# Patient Record
Sex: Female | Born: 1990 | Race: Black or African American | Hispanic: No | Marital: Single | Smoking: Never smoker
Health system: Southern US, Community
[De-identification: ages and names within clinical notes are randomized; demographics above are authoritative.]

## PROBLEM LIST (undated history)

## (undated) ENCOUNTER — Inpatient Hospital Stay (HOSPITAL_COMMUNITY): Payer: Self-pay

## (undated) DIAGNOSIS — Z789 Other specified health status: Secondary | ICD-10-CM

## (undated) HISTORY — PX: NO PAST SURGERIES: SHX2092

---

## 2016-03-27 LAB — OB RESULTS CONSOLE ABO/RH: RH Type: POSITIVE

## 2016-03-27 LAB — OB RESULTS CONSOLE HEPATITIS B SURFACE ANTIGEN: Hepatitis B Surface Ag: NEGATIVE

## 2016-03-27 LAB — OB RESULTS CONSOLE RUBELLA ANTIBODY, IGM: RUBELLA: IMMUNE

## 2016-03-27 LAB — OB RESULTS CONSOLE RPR: RPR: NONREACTIVE

## 2016-03-27 LAB — OB RESULTS CONSOLE GC/CHLAMYDIA
CHLAMYDIA, DNA PROBE: NEGATIVE
GC PROBE AMP, GENITAL: NEGATIVE

## 2016-03-27 LAB — OB RESULTS CONSOLE HIV ANTIBODY (ROUTINE TESTING): HIV: NONREACTIVE

## 2016-08-09 ENCOUNTER — Encounter: Payer: Self-pay | Admitting: Obstetrics and Gynecology

## 2016-08-09 ENCOUNTER — Inpatient Hospital Stay (HOSPITAL_COMMUNITY): Payer: Medicaid Other

## 2016-08-09 ENCOUNTER — Encounter (HOSPITAL_COMMUNITY): Payer: Self-pay | Admitting: *Deleted

## 2016-08-09 ENCOUNTER — Inpatient Hospital Stay (HOSPITAL_COMMUNITY)
Admission: AD | Admit: 2016-08-09 | Discharge: 2016-08-16 | DRG: 782 | Disposition: A | Discharge status: Home / Self Care | Payer: Medicaid Other | Source: Ambulatory Visit | Attending: Obstetrics & Gynecology | Admitting: Obstetrics & Gynecology

## 2016-08-09 DIAGNOSIS — O99213 Obesity complicating pregnancy, third trimester: Secondary | ICD-10-CM | POA: Diagnosis present

## 2016-08-09 DIAGNOSIS — O4410 Placenta previa with hemorrhage, unspecified trimester: Secondary | ICD-10-CM | POA: Diagnosis present

## 2016-08-09 DIAGNOSIS — Z3A29 29 weeks gestation of pregnancy: Secondary | ICD-10-CM | POA: Diagnosis not present

## 2016-08-09 DIAGNOSIS — Z6841 Body Mass Index (BMI) 40.0 and over, adult: Secondary | ICD-10-CM | POA: Diagnosis not present

## 2016-08-09 DIAGNOSIS — O4413 Placenta previa with hemorrhage, third trimester: Principal | ICD-10-CM | POA: Diagnosis present

## 2016-08-09 DIAGNOSIS — Z3A28 28 weeks gestation of pregnancy: Secondary | ICD-10-CM | POA: Diagnosis not present

## 2016-08-09 HISTORY — DX: Other specified health status: Z78.9

## 2016-08-09 LAB — CBC
HCT: 35.5 % — ABNORMAL LOW (ref 36.0–46.0)
HEMATOCRIT: 35.6 % — AB (ref 36.0–46.0)
HEMOGLOBIN: 12 g/dL (ref 12.0–15.0)
HEMOGLOBIN: 11.8 g/dL — AB (ref 12.0–15.0)
MCH: 27.9 pg (ref 26.0–34.0)
MCH: 28.2 pg (ref 26.0–34.0)
MCHC: 33.7 g/dL (ref 30.0–36.0)
MCHC: 33.2 g/dL (ref 30.0–36.0)
MCV: 83.8 fL (ref 78.0–100.0)
MCV: 83.9 fL (ref 78.0–100.0)
Platelets: 340 10*3/uL (ref 150–400)
Platelets: 346 10*3/uL (ref 150–400)
RBC: 4.25 MIL/uL (ref 3.87–5.11)
RBC: 4.23 MIL/uL (ref 3.87–5.11)
RDW: 14.1 % (ref 11.5–15.5)
RDW: 14.2 % (ref 11.5–15.5)
WBC: 11.5 10*3/uL — AB (ref 4.0–10.5)
WBC: 13.5 10*3/uL — AB (ref 4.0–10.5)

## 2016-08-09 LAB — DIC (DISSEMINATED INTRAVASCULAR COAGULATION)PANEL
D-Dimer, Quant: 1.13 ug/mL-FEU — ABNORMAL HIGH (ref 0.00–0.50)
Fibrinogen: 727 mg/dL — ABNORMAL HIGH (ref 210–475)
INR: 1.06
Prothrombin Time: 13.8 seconds (ref 11.4–15.2)
aPTT: 27 seconds (ref 24–36)

## 2016-08-09 LAB — PREPARE RBC (CROSSMATCH)

## 2016-08-09 LAB — KLEIHAUER-BETKE STAIN
# VIALS RHIG: 1
FETAL CELLS %: 0 %
QUANTITATION FETAL HEMOGLOBIN: 0 mL

## 2016-08-09 LAB — ABO/RH: ABO/RH(D): B POS

## 2016-08-09 LAB — TYPE AND SCREEN
ABO/RH(D): B POS
ANTIBODY SCREEN: NEGATIVE

## 2016-08-09 LAB — DIC (DISSEMINATED INTRAVASCULAR COAGULATION) PANEL
PLATELETS: 337 10*3/uL (ref 150–400)
SMEAR REVIEW: NONE SEEN

## 2016-08-09 IMAGING — US US MFM OB LIMITED
1 series · 12 of 14 positions shown · non-contrast
Comparison: none

[Series 1: us mfm ob limited · 14 acquisitions, 12 frames shown]
[im 1/14]
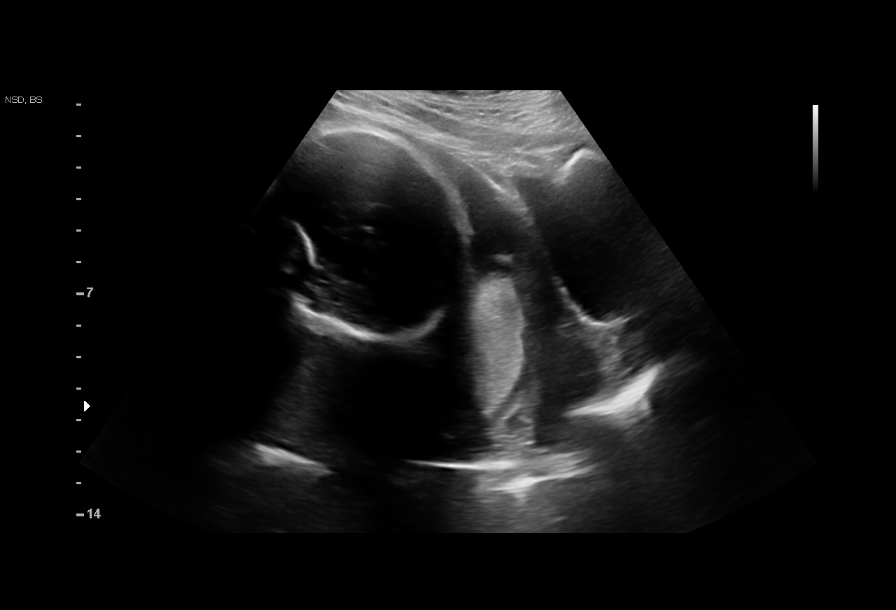
[im 2/14]
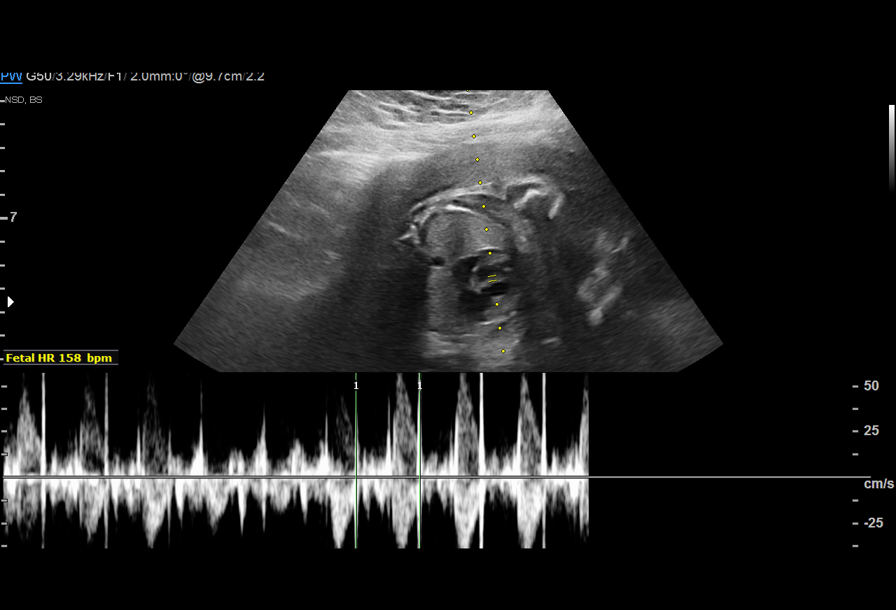
[im 3/14]
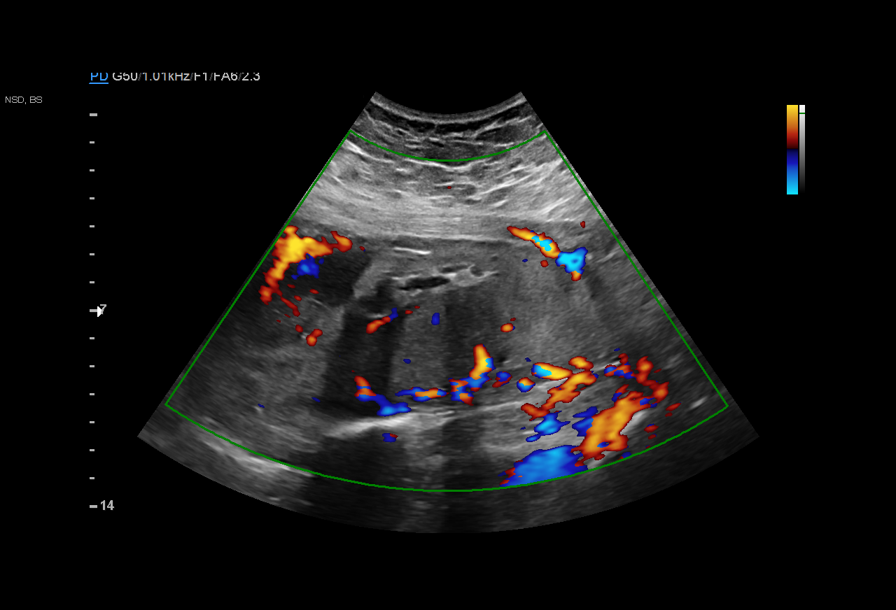
[im 5/14]
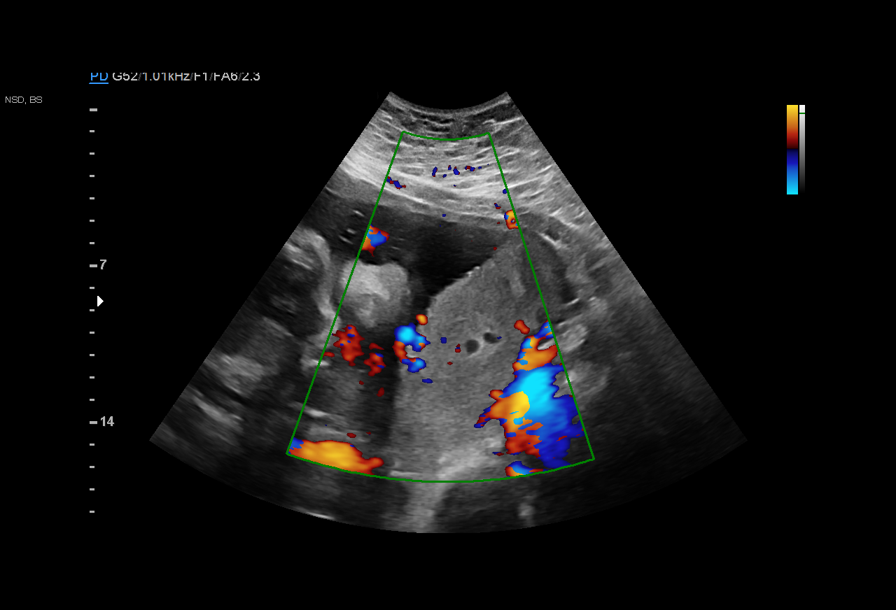
[im 6/14]
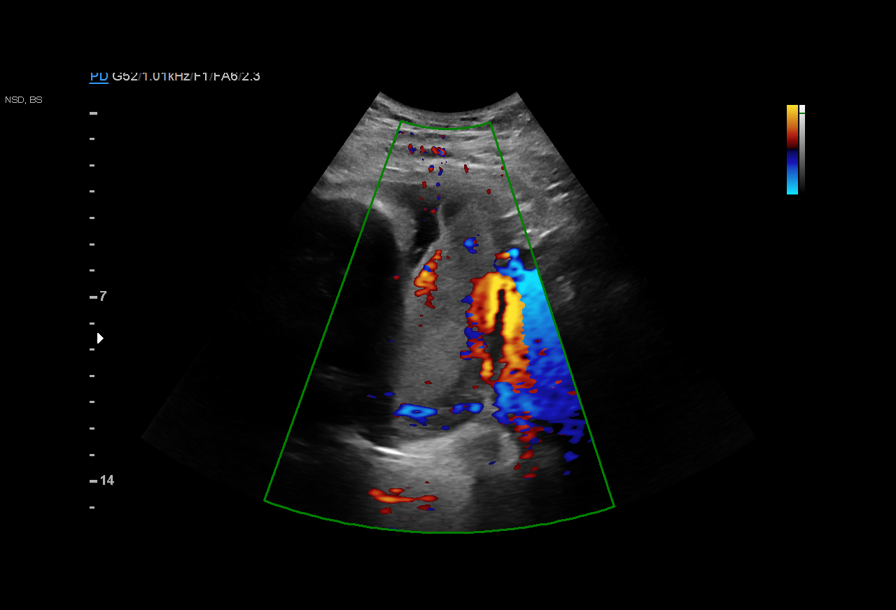
[im 7/14]
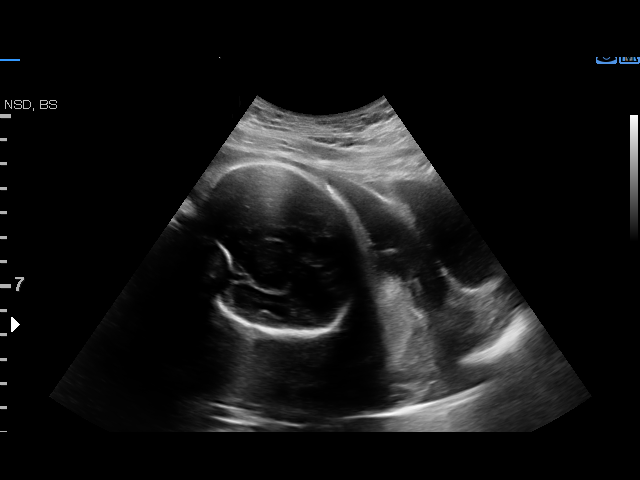
[im 8/14]
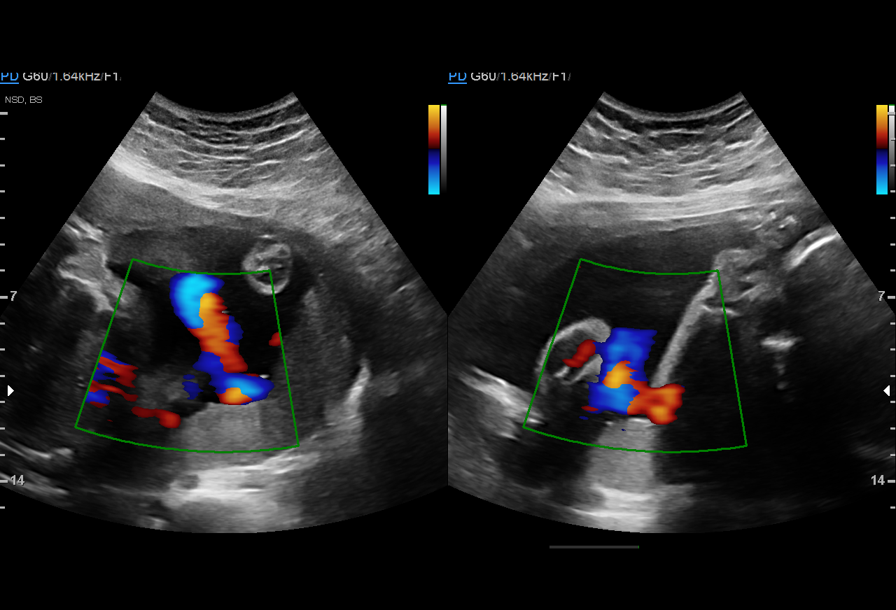
[im 9/14]
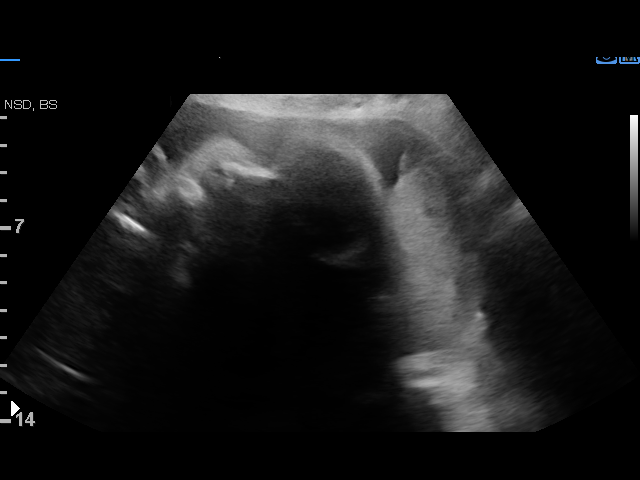
[im 10/14]
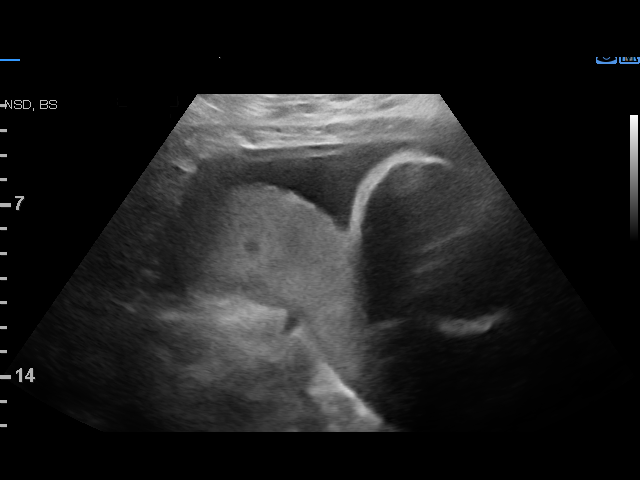
[im 12/14]
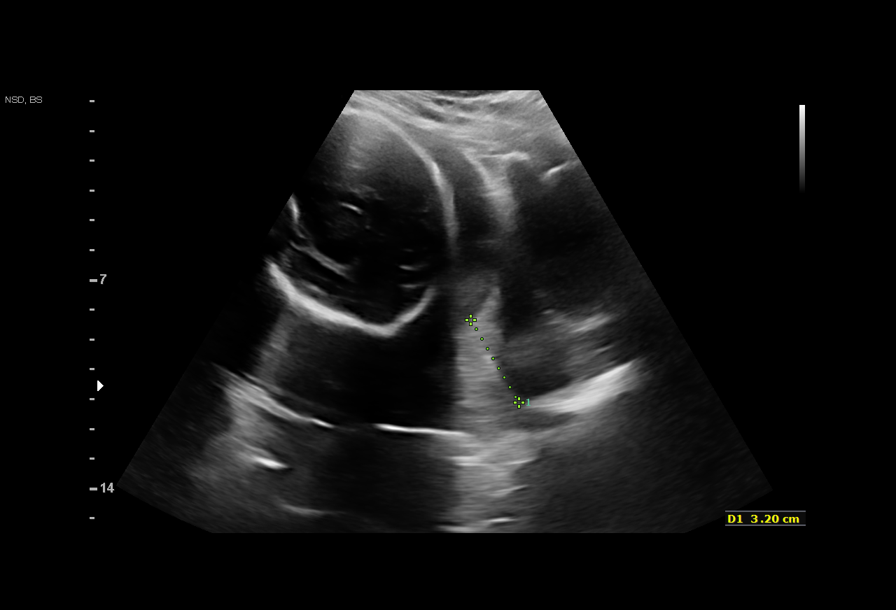
[im 13/14]
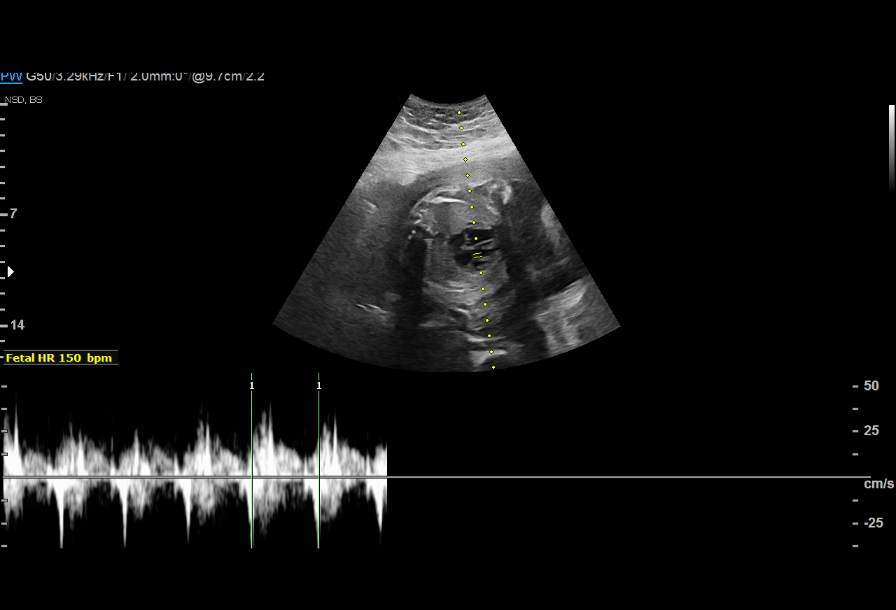
[im 14/14]
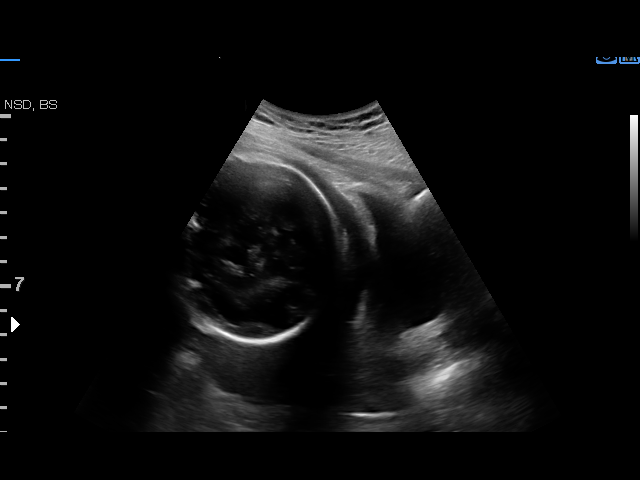

[12 of 14 positions shown; findings below may reference images not displayed]

[REDACTED]. [HOSPITAL],
Attending:        LARAINE         Secondary Phy.:    LARAINE Nursing-
MAU/Triage

1  LARAINE            [PHONE_NUMBER]      [PHONE_NUMBER]     [PHONE_NUMBER]
Indications

28 weeks gestation of pregnancy
Vaginal bleeding in pregnancy, third trimester [PJ]
OB History

Gravidity:    2         Term:   1        Prem:   0        SAB:   0
TOP:          0       Ectopic:  0        Living: 1
Fetal Evaluation

Num Of Fetuses:     1
Fetal Heart         150
Rate(bpm):
Cardiac Activity:   Observed
Presentation:       Cephalic
Placenta:           Posterior Previa
P. Cord Insertion:  Visualized

Amniotic Fluid
AFI FV:      Subjectively within normal limits

AFI Sum(cm)     %Tile       Largest Pocket(cm)
19.21           75

RUQ(cm)       RLQ(cm)       LUQ(cm)        LLQ(cm)
5.78
Gestational Age

Clinical EDD:  28w 6d                                        EDD:   [DATE]
Best:          28w 6d     Det. By:  Clinical EDD             EDD:   [DATE]
Cervix Uterus Adnexa

Cervix
Length:            3.2  cm.
Normal appearance by transabdominal scan.
Impression

DUPLICATE REPORT
SEE EARLIER SIGNED REPORT

## 2016-08-09 MED ORDER — MAGNESIUM SULFATE 50 % IJ SOLN
2.0000 g/h | INTRAVENOUS | Status: DC
  Administered 2016-08-09 – 2016-08-10 (×3): 2 g/h via INTRAVENOUS
  Filled 2016-08-09 (×2): qty 80

## 2016-08-09 MED ORDER — LACTATED RINGERS IV SOLN
INTRAVENOUS | Status: DC
  Administered 2016-08-09: 20:00:00 via INTRAVENOUS

## 2016-08-09 MED ORDER — ACETAMINOPHEN 325 MG PO TABS: 650.0000 mg | ORAL_TABLET | ORAL | Status: DC | PRN

## 2016-08-09 MED ORDER — BETAMETHASONE SOD PHOS & ACET 6 (3-3) MG/ML IJ SUSP
12.0000 mg | Freq: Once | INTRAMUSCULAR | Status: AC
  Administered 2016-08-10: 12 mg via INTRAMUSCULAR
  Filled 2016-08-09: qty 2

## 2016-08-09 MED ORDER — BETAMETHASONE SOD PHOS & ACET 6 (3-3) MG/ML IJ SUSP
12.0000 mg | Freq: Once | INTRAMUSCULAR | Status: AC
  Administered 2016-08-09: 12 mg via INTRAMUSCULAR
  Filled 2016-08-09: qty 2

## 2016-08-09 MED ORDER — MAGNESIUM SULFATE BOLUS VIA INFUSION
4.0000 g | Freq: Once | INTRAVENOUS | Status: AC
  Administered 2016-08-09: 4 g via INTRAVENOUS
  Filled 2016-08-09: qty 500

## 2016-08-09 MED ORDER — SODIUM CHLORIDE 0.9 % IV SOLN
INTRAVENOUS | Status: DC
  Administered 2016-08-09: 22:00:00 via INTRAVENOUS
  Administered 2016-08-10: 90 mL/h via INTRAVENOUS

## 2016-08-09 MED ORDER — SODIUM CHLORIDE 0.9 % IV SOLN
Freq: Once | INTRAVENOUS | Status: AC
  Administered 2016-08-09: 20:00:00 via INTRAVENOUS

## 2016-08-09 NOTE — MAU Note (Signed)
Arrived EMS Pt stated she started having bleeding/leaking about 30 min ago.Has Placenta previa. [facility]. C/o some cramping on and off.

## 2016-08-09 NOTE — MAU Provider Note (Signed)
S: Nancy Wolf is a 25 y.o. G2P1001 at 5219w6d who presents today with leaking of fluid, and vaginal bleeding. She has a known previa. [facility] She confirms fetal movement. O: VSS, afebrile Abdomen: soft, non-tender, gravid External: no lesion Vagina: small amount of blood and mucous. No fluid seen.  Cervix: pink, smooth, visually closed  Uterus: AGA FHT: 155, moderate with 10x10 accels, no decels  Toco: no UCs  Results for orders placed or performed during the hospital encounter of 08/09/16 (from the past 24 hour(s))  CBC     Status: Abnormal   Collection Time: 08/09/16  7:04 PM  Result Value Ref Range   WBC 11.5 (H) 4.0 - 10.5 K/uL   RBC 4.25 3.87 - 5.11 MIL/uL   Hemoglobin 12.0 12.0 - 15.0 g/dL   HCT 16.135.6 (L) 09.636.0 - 04.546.0 %   MCV 83.8 78.0 - 100.0 fL   MCH 28.2 26.0 - 34.0 pg   MCHC 33.7 30.0 - 36.0 g/dL   RDW 40.914.2 81.111.5 - 91.415.5 %   Platelets 346 150 - 400 K/uL  DIC (disseminated intravasc coag) panel     Status: Abnormal   Collection Time: 08/09/16  7:08 PM  Result Value Ref Range   Prothrombin Time 13.8 11.4 - 15.2 seconds   INR 1.06    aPTT 27 24 - 36 seconds   Fibrinogen 727 (H) 210 - 475 mg/dL   D-Dimer, Quant 7.821.13 (H) 0.00 - 0.50 ug/mL-FEU   Platelets 337 150 - 400 K/uL   Smear Review NO SCHISTOCYTES SEEN    A/P: Exam for ROM RN will report to attending MD

## 2016-08-09 NOTE — Progress Notes (Addendum)
OB Note  Preliminary u/s shows persistent posterior previa, with normal AFI and FHR. No more VB noted since patient has been here and examined and pt endorses less abdominal cramps. Fetus category I with accels and no UCs noted.   Cbc normal and DIC panel reassuring. B pos. 2 units cross match ordered, two PIVs. NICU aware.   Continue Mg for NP and tocolysis and repeat BMZ tomorrow.   Cornelia Copaharlie Jelissa Espiritu, Jr MD Attending Center for [facility] (Faculty Practice) 08/09/2016 Time: 2030

## 2016-08-09 NOTE — H&P (Signed)
HPI: Nancy Wolf is a 25 y.o. year old 542P1001 female at 3736w6d weeks gestation who presents to MAU by EMS reporting heavy vaginal bleeding w/ know placenta previa. Thinks she may have broken her water too. Denies abd pain. [place]. [place].   OB History    Gravida Para Term Preterm AB Living   2 1 1     1    SAB TAB Ectopic Multiple Live Births           1     Past Medical History:  Diagnosis Date  . Medical history non-contributory    Past Surgical History:  Procedure Laterality Date  . NO PAST SURGERIES     Family History: family history is not on file. Social History:  reports that she has never smoked. She has never used smokeless tobacco. She reports that she does not drink alcohol or use drugs.     Maternal Diabetes: No Genetic Screening: Unknown Maternal Ultrasounds/Referrals: Abnormal:  Findings:   Other: Fetal Ultrasounds or other Referrals:  None Maternal Substance Abuse:  No Significant Maternal Medications:  None Significant Maternal Lab Results:  None Other Comments:  Records not available. Normal except for previa per pt.   Review of Systems  Constitutional: Negative for chills and fever.  Gastrointestinal: Negative for abdominal pain.  Genitourinary:       Heavy vaginal bleeding w/ clots. Possible LOF   Endo/Heme/Allergies: Does not bruise/bleed easily.   Maternal Medical History:  Reason for admission: Rupture of membranes and vaginal bleeding.   Prenatal Complications - Diabetes: none.      Blood pressure 114/80, pulse 95, temperature 98.7 F (37.1 C), resp. rate 18, height 5\' 2"  (1.575 m), weight 224 lb (101.6 kg). Maternal Exam:  Abdomen: Patient reports no abdominal tenderness. Estimated fetal weight is 2-8.    Introitus: Normal vulva. Normal vagina.  Small-moderate amount of bright red blood.      Physical Exam  Nursing note and vitals reviewed. Constitutional: She is oriented to person, place, and  time. She appears well-developed and well-nourished. No distress.  Cardiovascular: Normal rate.   Respiratory: Effort normal and breath sounds normal.  GI: Soft. There is no tenderness.  Genitourinary: There is no injury on the right labia. There is no injury on the left labia. There is bleeding in the vagina.  Musculoskeletal: She exhibits no tenderness.  Neurological: She is alert and oriented to person, place, and time. She has normal reflexes.  Skin: Skin is warm and dry. She is not diaphoretic.  Psychiatric: She has a normal mood and affect.    Prenatal labs: Not available  Assessment: 1. Bleeding previa, hemodynamically stable 2. Fetal Wellbeing: Reassuring for gestational age 23. Pain Control: None 4. GBS: Unknown 5. 28.6 week IUP  Plan:  1. Admit to BS per consult with MD 2. CBC, Type and screen, DIC panel KB 3. Prepare 2 units of blood  4. Fern 5. Request records 6. Meg Sulfate for Neuroprotection 7. BMZ   Nancy KinsmanVirginia Newell Wolf 08/09/2016, 7:34 PM

## 2016-08-10 LAB — RAPID URINE DRUG SCREEN, HOSP PERFORMED
AMPHETAMINES: NOT DETECTED
BENZODIAZEPINES: NOT DETECTED
Barbiturates: NOT DETECTED
COCAINE: NOT DETECTED
OPIATES: NOT DETECTED
TETRAHYDROCANNABINOL: NOT DETECTED

## 2016-08-10 LAB — RPR: RPR Ser Ql: NONREACTIVE

## 2016-08-10 LAB — CBC
HCT: 35.6 % — ABNORMAL LOW (ref 36.0–46.0)
Hemoglobin: 11.8 g/dL — ABNORMAL LOW (ref 12.0–15.0)
MCH: 27.6 pg (ref 26.0–34.0)
MCHC: 33.1 g/dL (ref 30.0–36.0)
MCV: 83.4 fL (ref 78.0–100.0)
PLATELETS: 315 10*3/uL (ref 150–400)
RBC: 4.27 MIL/uL (ref 3.87–5.11)
RDW: 14.1 % (ref 11.5–15.5)
WBC: 13.2 10*3/uL — AB (ref 4.0–10.5)

## 2016-08-10 NOTE — Progress Notes (Signed)
Pt up to bsc with assistance and voided.  Scant amount of vaginal bldg noted

## 2016-08-10 NOTE — Progress Notes (Signed)
ACULTY PRACTICE ANTEPARTUM COMPREHENSIVE PROGRESS NOTE  Nancy Wolf is a 25 y.o. G2P1001 at 622w0d  who is admitted for VB d/t placenta previa    Fetal presentation is cephalic. Length of Stay:  1  Days  Subjective: Pt reports no more bleeding. She reports good fetal movement. Reports an occ cramp/pressure  Vitals:  Blood pressure 133/79, pulse (!) 103, temperature 97.9 F (36.6 C), temperature source Oral, resp. rate 20, height 5\' 2"  (1.575 m), weight 101.6 kg (224 lb), SpO2 100 %.   Physical Examination: Lungs clear Heart RRR Abd soft + BS gravid non tender  Fetal Monitoring:  + FHT's  Labs:  Results for orders placed or performed during the hospital encounter of 08/09/16 (from the past 24 hour(s))  CBC   Collection Time: 08/09/16  7:04 PM  Result Value Ref Range   WBC 11.5 (H) 4.0 - 10.5 K/uL   RBC 4.25 3.87 - 5.11 MIL/uL   Hemoglobin 12.0 12.0 - 15.0 g/dL   HCT 16.135.6 (L) 09.636.0 - 04.546.0 %   MCV 83.8 78.0 - 100.0 fL   MCH 28.2 26.0 - 34.0 pg   MCHC 33.7 30.0 - 36.0 g/dL   RDW 40.914.2 81.111.5 - 91.415.5 %   Platelets 346 150 - 400 K/uL  RPR   Collection Time: 08/09/16  7:04 PM  Result Value Ref Range   RPR Ser Ql Non Reactive Non Reactive  Type and screen [facility] [place]   Collection Time: 08/09/16  7:04 PM  Result Value Ref Range   ABO/RH(D) B POS    Antibody Screen NEG    Sample Expiration 08/09/2016   ABO/Rh   Collection Time: 08/09/16  7:04 PM  Result Value Ref Range   ABO/RH(D) B POS   Prepare RBC   Collection Time: 08/09/16  7:05 PM  Result Value Ref Range   Order Confirmation ORDER PROCESSED BY BLOOD BANK   Kleihauer-Betke stain   Collection Time: 08/09/16  7:08 PM  Result Value Ref Range   Fetal Cells % 0 %   Quantitation Fetal Hemoglobin 0 mL   # Vials RhIg 1   DIC (disseminated intravasc coag) panel   Collection Time: 08/09/16  7:08 PM  Result Value Ref Range   Prothrombin Time 13.8 11.4 - 15.2 seconds   INR 1.06    aPTT 27 24 - 36 seconds    Fibrinogen 727 (H) 210 - 475 mg/dL   D-Dimer, Quant 7.821.13 (H) 0.00 - 0.50 ug/mL-FEU   Platelets 337 150 - 400 K/uL   Smear Review NO SCHISTOCYTES SEEN   Type and screen [facility] [place]   Collection Time: 08/09/16  8:56 PM  Result Value Ref Range   Blood Product Unit Number [id number]    Unit Type and Rh 5100    Blood Product Expiration Date [id number]    Blood Product Unit Number [id number]    Unit Type and Rh 5100    Blood Product Expiration Date [id number]   CBC   Collection Time: 08/09/16 11:34 PM  Result Value Ref Range   WBC 13.5 (H) 4.0 - 10.5 K/uL   RBC 4.23 3.87 - 5.11 MIL/uL   Hemoglobin 11.8 (L) 12.0 - 15.0 g/dL   HCT 87.535.5 (L) 64.336.0 - 32.946.0 %   MCV 83.9 78.0 - 100.0 fL   MCH 27.9 26.0 - 34.0 pg   MCHC 33.2 30.0 - 36.0 g/dL   RDW 51.814.1 84.111.5 - 66.015.5 %   Platelets 340 150 - 400 K/uL  Rapid urine drug screen (hospital performed)   Collection Time: 08/10/16  2:30 AM  Result Value Ref Range   Opiates NONE DETECTED NONE DETECTED   Cocaine NONE DETECTED NONE DETECTED   Benzodiazepines NONE DETECTED NONE DETECTED   Amphetamines NONE DETECTED NONE DETECTED   Tetrahydrocannabinol NONE DETECTED NONE DETECTED   Barbiturates NONE DETECTED NONE DETECTED  CBC   Collection Time: 08/10/16  3:34 AM  Result Value Ref Range   WBC 13.2 (H) 4.0 - 10.5 K/uL   RBC 4.27 3.87 - 5.11 MIL/uL   Hemoglobin 11.8 (L) 12.0 - 15.0 g/dL   HCT 40.935.6 (L) 81.136.0 - 91.446.0 %   MCV 83.4 78.0 - 100.0 fL   MCH 27.6 26.0 - 34.0 pg   MCHC 33.1 30.0 - 36.0 g/dL   RDW 78.214.1 95.611.5 - 21.315.5 %   Platelets 315 150 - 400 K/uL    Imaging Studies:       Medications:  Scheduled . betamethasone acetate-betamethasone sodium phosphate  12 mg Intramuscular Once   I have reviewed the patient's current medications.  ASSESSMENT: Patient Active Problem List   Diagnosis Date Noted  . Antepartum hemorrhage from placenta previa 08/09/2016    PLAN: Stable. Second dose of celestone this  evening Continue with NPO status and Magnesium    Hermina StaggersMichael L Abdirizak Richison 08/10/2016,1:50 PM

## 2016-08-11 ENCOUNTER — Encounter (HOSPITAL_COMMUNITY): Payer: Self-pay | Admitting: *Deleted

## 2016-08-11 LAB — CBC WITH DIFFERENTIAL/PLATELET
Basophils Absolute: 0 10*3/uL (ref 0.0–0.1)
Basophils Relative: 0 %
Eosinophils Absolute: 0 10*3/uL (ref 0.0–0.7)
Eosinophils Relative: 0 %
HCT: 31.7 % — ABNORMAL LOW (ref 36.0–46.0)
Hemoglobin: 10.5 g/dL — ABNORMAL LOW (ref 12.0–15.0)
Lymphocytes Relative: 8 %
Lymphs Abs: 1.3 10*3/uL (ref 0.7–4.0)
MCH: 27.9 pg (ref 26.0–34.0)
MCHC: 33.1 g/dL (ref 30.0–36.0)
MCV: 84.1 fL (ref 78.0–100.0)
Monocytes Absolute: 0.3 10*3/uL (ref 0.1–1.0)
Monocytes Relative: 2 %
Neutro Abs: 15.5 10*3/uL — ABNORMAL HIGH (ref 1.7–7.7)
Neutrophils Relative %: 90 %
Platelets: 325 10*3/uL (ref 150–400)
RBC: 3.77 MIL/uL — ABNORMAL LOW (ref 3.87–5.11)
RDW: 14.2 % (ref 11.5–15.5)
WBC: 17 10*3/uL — ABNORMAL HIGH (ref 4.0–10.5)

## 2016-08-11 MED ORDER — ZOLPIDEM TARTRATE 5 MG PO TABS: 5.0000 mg | ORAL_TABLET | Freq: Every evening | ORAL | Status: DC | PRN

## 2016-08-11 MED ORDER — CALCIUM CARBONATE ANTACID 500 MG PO CHEW: 2.0000 | CHEWABLE_TABLET | ORAL | Status: DC | PRN

## 2016-08-11 MED ORDER — PRENATAL MULTIVITAMIN CH
1.0000 | ORAL_TABLET | Freq: Every day | ORAL | Status: DC
  Administered 2016-08-11 – 2016-08-16 (×6): 1 via ORAL
  Filled 2016-08-11 (×6): qty 1

## 2016-08-11 MED ORDER — DOCUSATE SODIUM 100 MG PO CAPS: 100.0000 mg | ORAL_CAPSULE | Freq: Two times a day (BID) | ORAL | Status: DC | PRN

## 2016-08-11 NOTE — Progress Notes (Signed)
L&D Note  08/11/2016 - 0930 (late entry)  26 y.o. G2P1001 7467w1d. Pregnancy complicated by BMI 45, posterior previa  Patient Active Problem List   Diagnosis Date Noted  . Antepartum hemorrhage from placenta previa 08/09/2016    Ms. Dalbert GarnetJoretta Siebenaler is admitted for VB   Subjective:  No VB, pain or s/s of Mg toxicity  Objective:    Current Vital Signs 24h Vital Sign Ranges  T 98.6 F (37 C) Temp  Avg: 98.4 F (36.9 C)  Min: 97.9 F (36.6 C)  Max: 98.6 F (37 C)  BP 125/79 BP  Min: 111/66  Max: 138/78  HR (!) 101 Pulse  Avg: 104.4  Min: 92  Max: 128  RR 18 Resp  Avg: 17.6  Min: 15  Max: 20  SaO2 100 %   No Data Recorded       24 Hour I/O Current Shift I/O  Time Ins Outs 12/31 0701 - 01/01 0700 In: 3010.8 [P.O.:760; I.V.:2250.8] Out: 2250 [Urine:2250] 01/01 0701 - 01/01 1900 In: 235.5 [I.V.:235.5] Out: 0    FHR: 135 baseline, +accels, no decel, mod variability Toco: quiet Gen: NAD  Labs:   Recent Labs Lab 08/09/16 2334 08/10/16 0334 08/11/16 0509  WBC 13.5* 13.2* 17.0*  HGB 11.8* 11.8* 10.5*  HCT 35.5* 35.6* 31.7*  PLT 340 315 325   B POS  Negative: KB, UDS, DIC panel  Medications Current Facility-Administered Medications  Medication Dose Route Frequency Provider Last Rate Last Dose  . 0.9 %  sodium chloride infusion   Intravenous Continuous [place] Bingharlie Malic Rosten, MD   Stopped at 08/11/16 0940  . acetaminophen (TYLENOL) tablet 650 mg  650 mg Oral Q4H PRN [place] Bingharlie Bennie Chirico, MD      . calcium carbonate (TUMS - dosed in mg elemental calcium) chewable tablet 400 mg of elemental calcium  2 tablet Oral Q4H PRN [place] Bingharlie Genola Yuille, MD      . docusate sodium (COLACE) capsule 100 mg  100 mg Oral BID PRN [place] Bingharlie Atreus Hasz, MD      . prenatal multivitamin tablet 1 tablet  1 tablet Oral Q1200 [place] Bingharlie Blaire Palomino, MD      . zolpidem (AMBIEN) tablet 5 mg  5 mg Oral QHS PRN [place] Bingharlie Shazia Mitchener, MD        Assessment & Plan:  Patient doing well *Pregnancy: category I with accels, fetal status  reassuring. [facility] tomorrow to get 12/20 growth u/s done then. Needs 28wk labs. Can consider doing them tomorrow *Previa: persistent on admission. No VB since admission with reassuring labs. D/w anesthesia on admission -Has two PIVs and two units PRBCs crossmatched *Preterm: s/p BMZ on 12/30 and 12/31. NICU aware. Will d/c Mg (for fetal NP) and keep patient NPO for a few hours in case it was working as a tocolytic and if no uterine activity, okay for regular diet and OOB.  *Obesity: see above *PPx: SCDs, OOB ad lib once off Mg *FEN/GI: NPO for now (see above) *Dispo: at least 7 days s/p last bleed.   Cornelia Copaharlie Katja Blue, Jr. MD Attending Center for [facility](Faculty Practice)

## 2016-08-11 NOTE — Progress Notes (Signed)
Pt reports scant amount bloody vaginal discharge when wiping only.  Pt states she's not wearing a sanitary napkin, RN encouraged pt to apply sanitary napkin to help track bleeding occurrence.

## 2016-08-12 LAB — TYPE AND SCREEN
ABO/RH(D): B POS
ANTIBODY SCREEN: NEGATIVE

## 2016-08-12 NOTE — Progress Notes (Signed)
Patient ID: Nancy Wolf, female   DOB: Jun 30, 1991, 26 y.o.   MRN: [id number] FACULTY PRACTICE ANTEPARTUM NOTE  Nancy Wolf is a 26 y.o. G2P1001 at 2623w2d  who is admitted for vaginal bleeding with known placenta previa.   Fetal presentation is cephalic. Length of Stay:  3  Days  Subjective: No bleeding or brown discharge.  Patient reports good fetal movement.   She reports no uterine contractions She reports no bleeding  She reports no loss of fluid per vagina.  Vitals:  Blood pressure 126/60, pulse 69, temperature 98.1 F (36.7 C), temperature source Oral, resp. rate 18, height 5\' 2"  (1.575 m), weight 224 lb (101.6 kg), SpO2 97 %. Physical Examination:  General appearance - alert, well appearing, and in no distress Heart - normal rate Abdomen - soft, nontender, nondistended, no masses or organomegaly Extremities - peripheral pulses normal, no pedal edema, no clubbing or cyanosis Extremities: extremities normal, atraumatic, no cyanosis or edema   Fetal Monitoring:  Baseline: 135 bpm, Variability: Good {> 6 bpm), Accelerations: Non-reactive but appropriate for gestational age and Decelerations: Variable: mild  Labs:  No results found for this or any previous visit (from the past 24 hour(s)).  Imaging Studies:      Medications:  Scheduled . prenatal multivitamin  1 tablet Oral Q1200   I have reviewed the patient's current medications.  ASSESSMENT: Active Problems:   Antepartum hemorrhage from placenta previa   PLAN: Continue NST - appropriate for GA Continue inpatient monitoring for 5-7 days after last bleed Delivery for maternal or fetal concerns  Continue routine antenatal care.   Levie HeritageJacob J Stinson, DO 08/12/2016,2:35 PM

## 2016-08-12 NOTE — Progress Notes (Signed)
OB Note Patient sleeping. No issues o/n per RN. Will come by later today to see patient.   Cornelia Copaharlie Hemi Chacko, Jr MD Attending Center for [facility] (Faculty Practice) 08/12/2016 Time: [phone]

## 2016-08-13 LAB — TYPE AND SCREEN
ABO/RH(D): B POS
ANTIBODY SCREEN: NEGATIVE
Unit division: 0
Unit division: 0

## 2016-08-13 NOTE — Progress Notes (Signed)
FACULTY PRACTICE ANTEPARTUM(COMPREHENSIVE) NOTE  Nancy Wolf is a 26 y.o. G2P1001 at 56106w3d who is admitted for placenta previa and bleeding.   Fetal presentation is cephalic. Length of Stay:  4  Days  Subjective:  Patient reports the fetal movement as active. Patient reports uterine contraction  activity as none. Patient reports  vaginal bleeding as none. Patient describes fluid per vagina as None.  Vitals:  Blood pressure 126/60, pulse 69, temperature 98.4 F (36.9 C), temperature source Oral, resp. rate 18, height 5\' 2"  (1.575 m), weight 101.6 kg (224 lb), SpO2 97 %. Physical Examination:  General appearance - alert, well appearing, and in no distress Heart - normal rate and regular rhythm Abdomen - soft, nontender, nondistended Fundal Height:  size equals dates Cervical Exam: Not evaluated. Extremities: extremities normal, atraumatic, no cyanosis or edema and Homans sign is negative, no sign of DVT Membranes:intact  Fetal Monitoring    Fetal Heart Rate A  Mode External filed at 08/12/2016 1526  Baseline Rate (A) 155 bpm filed at 08/12/2016 1526  Variability 6-25 BPM filed at 08/12/2016 1526  Accelerations 15 x 15 filed at 08/12/2016 1526  Decelerations None filed at 08/12/2016 1526     Labs:  Results for orders placed or performed during the hospital encounter of 08/09/16 (from the past 24 hour(s))  Type and screen [facility] [place]   Collection Time: 08/12/16  8:52 PM  Result Value Ref Range   ABO/RH(D) B POS    Antibody Screen NEG    Sample Expiration 08/15/2016     Imaging Studies:     Currently EPIC will not allow sonographic studies to automatically populate into notes.  In the meantime, copy and paste results into note or free text.  Medications:  Scheduled . prenatal multivitamin  1 tablet Oral Q1200   I have reviewed the patient's current medications.  ASSESSMENT: Patient Active Problem List   Diagnosis Date Noted  . Antepartum  hemorrhage from placenta previa 08/09/2016    PLAN: Continue observation in hospital   Scheryl DarterJames Aleaya Latona 08/13/2016,7:01 AM

## 2016-08-15 DIAGNOSIS — O4413 Placenta previa with hemorrhage, third trimester: Principal | ICD-10-CM

## 2016-08-15 DIAGNOSIS — Z3A29 29 weeks gestation of pregnancy: Secondary | ICD-10-CM

## 2016-08-15 LAB — TYPE AND SCREEN
ABO/RH(D): B POS
ANTIBODY SCREEN: NEGATIVE

## 2016-08-15 NOTE — Progress Notes (Signed)
Patient has been reporting no vaginal bleeding. Probed to see if patient sees any color on tissue when wiping. Patient reports red. Advised this is bleeding if it is red. Patient says noting ever reaches her pad. Advised to keep us posted on any changes in vaginal discharge.

## 2016-08-15 NOTE — Progress Notes (Signed)
Patient ID: Nancy GarnetJoretta Wolf, female   DOB: [birth year], 26 y.o.   MRN: [id number] ACULTY PRACTICE ANTEPARTUM COMPREHENSIVE PROGRESS NOTE  Nancy GarnetJoretta Temples is a 26 y.o. G2P1001 at 5967w5d  who is admitted for third trimester bleeding. Posterior previa noted  Fetal presentation is cephalic. Length of Stay:  6  Days  Subjective: Pt with no complaints.  She denies bleeding since admission Patient reports good fetal movement.  She reports no uterine contractions- she does report some cramping last night, no bleeding and no loss of fluid per vagina.  Vitals:  Blood pressure 124/73, pulse (!) 113, temperature 98.4 F (36.9 C), temperature source Oral, resp. rate 20, height 5\' 2"  (1.575 m), weight 224 lb (101.6 kg), SpO2 100 %. Physical Examination: General appearance - alert, well appearing, and in no distress Abdomen - soft, nontender, nondistended, no masses or organomegaly gravid Extremities - peripheral pulses normal, no pedal edema, no clubbing or cyanosis Cervical Exam: Not evaluated.  Membranes:intact  Fetal Monitoring:  Baseline: 150's bpm, Variability: Good {> 6 bpm), Accelerations: Non-reactive but appropriate for gestational age, Decelerations: Absent and Toco: no ctx  Labs:  CBC Latest Ref Rng & Units 08/11/2016 08/10/2016 08/09/2016  WBC 4.0 - 10.5 K/uL 17.0(H) 13.2(H) 13.5(H)  Hemoglobin 12.0 - 15.0 g/dL 10.5(L) 11.8(L) 11.8(L)  Hematocrit 36.0 - 46.0 % 31.7(L) 35.6(L) 35.5(L)  Platelets 150 - 400 K/uL 325 315 340   Imaging Studies:    None pending   Medications:  Scheduled . prenatal multivitamin  1 tablet Oral Q1200   I have reviewed the patient's current medications.  ASSESSMENT: Patient Active Problem List   Diagnosis Date Noted  . Antepartum hemorrhage from placenta previa 08/09/2016    PLAN: Cont inpt obs Deliver for maternal or fetal indications If no bleeding overnight, pt may be discharged in the am 1/6s   Continue routine antenatal care.  Aodhan Scheidt  Harraway-Smith 08/15/2016,6:01 AM

## 2016-08-16 NOTE — Progress Notes (Signed)
Pt reports no vaginal bleeding the passed 2x to bathroom

## 2016-08-16 NOTE — Discharge Instructions (Signed)
Placenta Previa Placenta previa is a condition in which the placenta implants in the lower part of the uterus in pregnant women. The placenta either partially or completely covers the opening to the cervix. This is a problem because the baby must pass through the cervix during delivery. There are three types of placenta previa:  Marginal placenta previa. The placenta reaches within an inch (2.5 cm) of the cervical opening but does not cover it.  Partial placenta previa. The placenta covers part of the cervical opening.  Complete placenta previa. The placenta covers the entire cervical opening. If the previa is marginal or partial and it is diagnosed in the first half of pregnancy, the placenta may move into a normal position as the pregnancy progresses and may no longer cover the cervix. It is important to keep all prenatal visits with your health care provider so you can be more closely monitored. What are the causes? The cause of this condition is not known. What increases the risk? This condition is more likely to develop in women who:  Are carrying more than one baby (multiples).  Have an abnormally shaped uterus.  Have scars on the lining of the uterus.  Have had surgeries involving the uterus, such as a cesarean delivery.  Have delivered a baby before.  Have a history of placenta previa.  Have smoked or used cocaine during pregnancy.  Are age 35 or older during pregnancy. What are the signs or symptoms? The main symptom of this condition is sudden, painless vaginal bleeding during the second half of pregnancy. The amount of bleeding can be very light at first, and it usually stops on its own. Heavier bleeding episodes may also happen. Some women with placenta previa may have no bleeding at all. How is this diagnosed?  This condition is diagnosed:  From an ultrasound. This test uses sound waves to find where the placenta is located before you have any bleeding  episodes.  During a checkup after vaginal bleeding is noticed.  If you are diagnosed with a partial or complete previa, digital exams with fingers will generally be avoided. Your health care provider will still perform a speculum exam.  If you did not have an ultrasound during your pregnancy, placenta previa may not be diagnosed until bleeding occurs during labor. How is this treated? Treatment for this condition may include:  Decreased activity.  Bed rest at home or in the hospital.  Pelvic rest. Nothing is placed inside the vagina during pelvic rest. This means not having sex and not using tampons or douches.  A blood transfusion to replace blood that you have lost (maternal blood loss).  A cesarean delivery. This may be performed if:  The bleeding is heavy and cannot be controlled.  The placenta completely covers the cervix.  Medicines to stop premature labor or to help the baby's lungs to mature. This treatment may be used if you need delivery before your pregnancy is full-term. Your treatment will be decided based on:  How much you are bleeding, or whether the bleeding has stopped.  How far along you are in your pregnancy.  The condition of your baby.  The type of placenta previa that you have. Follow these instructions at home:  Get plenty of rest and lessen activity as told by your health care provider.  Stay on bed rest for as long as told by your health care provider.  Do not have sex, use tampons, use a douche, or place anything inside of your   vagina if your health care provider recommended pelvic rest.  Take over-the-counter and prescription medicines as told by your health care provider.  Keep all follow-up visits as told by your health care provider. This is important. Get help right away if:  You have vaginal bleeding, even if in small amounts and even if you have no pain.  You have cramping or regular contractions.  You have pain in your abdomen or  your lower back.  You have a feeling of increased pressure in your pelvis.  You have increased watery or bloody mucus from the vagina. This information is not intended to replace advice given to you by your health care provider. Make sure you discuss any questions you have with your health care provider. Document Released: 07/28/2005 Document Revised: 04/16/2016 Document Reviewed: 02/09/2016 Elsevier Interactive Patient Education  2017 [employer]vier Inc.    Vaginal Bleeding During Pregnancy, Third Trimester A small amount of bleeding (spotting) from the vagina is common in pregnancy. Sometimes the bleeding is normal and is not a problem, and sometimes it is a sign of something serious. Be sure to tell your doctor about any bleeding from your vagina right away. Follow these instructions at home:  Watch your condition for any changes.  Follow your doctor's instructions about how active you can be.  If you are on bed rest:  You may need to stay in bed and only get up to use the bathroom.  You may be allowed to do some activities.  If you need help, make plans for someone to help you.  Write down:  The number of pads you use each day.  How often you change pads.  How soaked (saturated) your pads are.  Do not use tampons.  Do not douche.  Do not have sex or orgasms until your doctor says it is okay.  Follow your doctor's advice about lifting, driving, and doing physical activities.  If you pass any tissue from your vagina, save the tissue so you can show it to your doctor.  Only take medicines as told by your doctor.  Do not take aspirin because it can make you bleed.  Keep all follow-up visits as told by your doctor. Contact a doctor if:  You bleed from your vagina.  You have cramps.  You have labor pains.  You have a fever that does not go away after you take medicine. Get help right away if:  You have very bad cramps in your back or belly (abdomen).  You have  chills.  You have a gush of fluid from your vagina.  You pass large clots or tissue from your vagina.  You bleed more.  You feel light-headed or weak.  You pass out (faint).  You do not feel your baby move around as much as before. This information is not intended to replace advice given to you by your health care provider. Make sure you discuss any questions you have with your health care provider. Document Released: 12/12/2013 Document Revised: 01/03/2016 Document Reviewed: 04/04/2013 Elsevier Interactive Patient Education  2017 [employer]

## 2016-08-16 NOTE — Progress Notes (Signed)
Discharge teaching complete. Pt understood all instructions and did not have any questions. Pt pushed via wheelchair and discharged home to family. 

## 2016-08-16 NOTE — Discharge Summary (Signed)
Antenatal Physician Discharge Summary  Patient ID: Nancy Wolf MRN: [id number] DOB/AGE: 02-Feb-1991 25 y.o.  Admit date: 08/09/2016 Discharge date: 08/16/2016  Admission Diagnoses:  Placenta previa with bleeding  ANTENATAL DISCHARGE SUMMARY  Discharge Diagnoses:  Persistent posterior previa, stable  Prenatal Procedures: ultrasound, NST  Intrapartum Procedures: Neonatology, Maternal Fetal Medicine Observation for bleeding  Significant Diagnostic Studies:  Results for orders placed or performed during the hospital encounter of 08/09/16 (from the past 168 hour(s))  CBC   Collection Time: 08/09/16  7:04 PM  Result Value Ref Range   WBC 11.5 (H) 4.0 - 10.5 K/uL   RBC 4.25 3.87 - 5.11 MIL/uL   Hemoglobin 12.0 12.0 - 15.0 g/dL   HCT 40.9 (L) 81.1 - 91.4 %   MCV 83.8 78.0 - 100.0 fL   MCH 28.2 26.0 - 34.0 pg   MCHC 33.7 30.0 - 36.0 g/dL   RDW 78.2 95.6 - 21.3 %   Platelets 346 150 - 400 K/uL  RPR   Collection Time: 08/09/16  7:04 PM  Result Value Ref Range   RPR Ser Ql Non Reactive Non Reactive  Type and screen [facility] [place]   Collection Time: 08/09/16  7:04 PM  Result Value Ref Range   ABO/RH(D) B POS    Antibody Screen NEG    Sample Expiration 08/09/2016   ABO/Rh   Collection Time: 08/09/16  7:04 PM  Result Value Ref Range   ABO/RH(D) B POS   Prepare RBC   Collection Time: 08/09/16  7:05 PM  Result Value Ref Range   Order Confirmation ORDER PROCESSED BY BLOOD BANK   Kleihauer-Betke stain   Collection Time: 08/09/16  7:08 PM  Result Value Ref Range   Fetal Cells % 0 %   Quantitation Fetal Hemoglobin 0 mL   # Vials RhIg 1   DIC (disseminated intravasc coag) panel   Collection Time: 08/09/16  7:08 PM  Result Value Ref Range   Prothrombin Time 13.8 11.4 - 15.2 seconds   INR 1.06    aPTT 27 24 - 36 seconds   Fibrinogen 727 (H) 210 - 475 mg/dL   D-Dimer, Quant 0.86 (H) 0.00 - 0.50 ug/mL-FEU   Platelets 337 150 - 400 K/uL   Smear Review NO  SCHISTOCYTES SEEN   Type and screen [facility] [place]   Collection Time: 08/09/16  8:56 PM  Result Value Ref Range   ABO/RH(D) B POS    Antibody Screen NEG    Sample Expiration 08/12/2016    Unit Number [id number]    Blood Component Type RED CELLS,LR    Unit division 00    Status of Unit REL FROM [facility]    Transfusion Status OK TO TRANSFUSE    Crossmatch Result Compatible    Unit Number [id number]    Blood Component Type RBC LR PHER1    Unit division 00    Status of Unit REL FROM [facility]    Transfusion Status OK TO TRANSFUSE    Crossmatch Result Compatible   CBC   Collection Time: 08/09/16 11:34 PM  Result Value Ref Range   WBC 13.5 (H) 4.0 - 10.5 K/uL   RBC 4.23 3.87 - 5.11 MIL/uL   Hemoglobin 11.8 (L) 12.0 - 15.0 g/dL   HCT 66.4 (L) 40.3 - 47.4 %   MCV 83.9 78.0 - 100.0 fL   MCH 27.9 26.0 - 34.0 pg   MCHC 33.2 30.0 - 36.0 g/dL   RDW 25.9 56.3 - 87.5 %  Platelets 340 150 - 400 K/uL  Rapid urine drug screen (hospital performed)   Collection Time: 08/10/16  2:30 AM  Result Value Ref Range   Opiates NONE DETECTED NONE DETECTED   Cocaine NONE DETECTED NONE DETECTED   Benzodiazepines NONE DETECTED NONE DETECTED   Amphetamines NONE DETECTED NONE DETECTED   Tetrahydrocannabinol NONE DETECTED NONE DETECTED   Barbiturates NONE DETECTED NONE DETECTED  CBC   Collection Time: 08/10/16  3:34 AM  Result Value Ref Range   WBC 13.2 (H) 4.0 - 10.5 K/uL   RBC 4.27 3.87 - 5.11 MIL/uL   Hemoglobin 11.8 (L) 12.0 - 15.0 g/dL   HCT 16.135.6 (L) 09.636.0 - 04.546.0 %   MCV 83.4 78.0 - 100.0 fL   MCH 27.6 26.0 - 34.0 pg   MCHC 33.1 30.0 - 36.0 g/dL   RDW 40.914.1 81.111.5 - 91.415.5 %   Platelets 315 150 - 400 K/uL  CBC with Differential/Platelet   Collection Time: 08/11/16  5:09 AM  Result Value Ref Range   WBC 17.0 (H) 4.0 - 10.5 K/uL   RBC 3.77 (L) 3.87 - 5.11 MIL/uL   Hemoglobin 10.5 (L) 12.0 - 15.0 g/dL   HCT 78.231.7 (L) 95.636.0 - 21.346.0 %   MCV 84.1 78.0 - 100.0 fL   MCH 27.9 26.0 - 34.0  pg   MCHC 33.1 30.0 - 36.0 g/dL   RDW 08.614.2 57.811.5 - 46.915.5 %   Platelets 325 150 - 400 K/uL   Neutrophils Relative % 90 %   Neutro Abs 15.5 (H) 1.7 - 7.7 K/uL   Lymphocytes Relative 8 %   Lymphs Abs 1.3 0.7 - 4.0 K/uL   Monocytes Relative 2 %   Monocytes Absolute 0.3 0.1 - 1.0 K/uL   Eosinophils Relative 0 %   Eosinophils Absolute 0.0 0.0 - 0.7 K/uL   Basophils Relative 0 %   Basophils Absolute 0.0 0.0 - 0.1 K/uL  Type and screen [facility] [place]   Collection Time: 08/12/16  8:52 PM  Result Value Ref Range   ABO/RH(D) B POS    Antibody Screen NEG    Sample Expiration 08/15/2016   [facility] screen [facility] [place] [facility]   Collection Time: 08/15/16  8:48 PM  Result Value Ref Range   ABO/RH(D) B POS    Antibody Screen NEG    Sample Expiration 08/18/2016     Treatments: steroids: betamethasone on 12/30 & 12/31; Magnesium for neuroprotection.  Hospital Course:  This is a 26 y.o. G2P1001 with IUP at 1195w6d admitted for vaginal bleeding. Patient had known placenta previa, and per 12/30 ultrasound has persistent posterior previa.  She was initially started on magnesium sulfate for neuroprotection for 24 hours and also received betamethasone x 2 doses. She was observed for 7 days without any further acute bleeding episodes. Fetal heart rate monitoring remained reassuring, and she had no signs/symptoms of repeat bleeding other maternal-fetal concerns. She was deemed stable for discharge to home with outpatient follow up.  Discharge Exam: BP 124/84 (BP Location: Left Arm)   Pulse (!) 109   Temp 97.8 F (36.6 C) (Oral)   Resp 18   Ht 5\' 2"  (1.575 m)   Wt 224 lb (101.6 kg)   SpO2 98%   BMI 40.97 kg/m  General appearance: alert, cooperative, appears stated age and no distress Head: Normocephalic, without obvious abnormality, atraumatic Resp: clear to auscultation bilaterally Cardio: regular rate and rhythm, S1, S2 normal, no murmur, click, rub or gallop GI: soft,  non-tender; bowel sounds normal; no masses,  no organomegaly and gravida appropriate for gestational age. Pelvic: cervix normal in appearance, external genitalia normal, no adnexal masses or tenderness, rectovaginal septum normal, uterus normal size, shape, and consistency, vagina normal without discharge and NO ACTIVE BLEEDING noted on speculum exam today, minimal amounts of old brown bloody discharge noted. Extremities: extremities normal, atraumatic, no cyanosis or edema, Homans sign is negative, no sign of DVT and no edema, redness or tenderness in the calves or thighs  Discharge Condition: stable  Disposition: Final discharge disposition not confirmed  Discharge Instructions    Discharge patient    Complete by:  As directed    Discharge disposition:  01-Home or Self Care   Discharge patient date:  08/16/2016     Allergies as of 08/16/2016   No Known Allergies     Medication List    TAKE these medications   acetaminophen 500 MG tablet Commonly known as:  TYLENOL Take 500-1,000 mg by mouth every 6 (six) hours as needed for mild pain, moderate pain or headache.   calcium carbonate 750 MG chewable tablet Commonly known as:  TUMS EX Chew 2 tablets by mouth as needed for heartburn.   prenatal multivitamin Tabs tablet Take 1 tablet by mouth daily.      Follow-up Information    [facility]. Schedule an appointment as soon as possible for a visit in 1 week(s).   Specialty:  Rehabilitation Why:  Follow up for OB visit and bleeding Contact information: [address] [place] [address] [phone]           Signed: Jen Mow, DO OB Fellow 08/16/2016, 3:58 PM

## 2016-08-16 NOTE — Progress Notes (Signed)
Patient has denied any vaginal bleeding during night since yesterday am. She stated not even when she wipes.

## 2016-08-16 NOTE — Progress Notes (Addendum)
Patient ID: Nancy Wolf, female   DOB: [birth year], 26 y.o.   MRN: [id number]  ANTEPARTUM PROGRESS NOTE  Nancy Wolf is a 26 y.o. G2P1001 at 1065w6d who is admitted for vaginal bleeding with known posterior previa.  Estimated Date of Delivery: 10/26/16 Fetal presentation is cephalic.  Length of Stay:  7 Days. Admitted 08/09/2016  Subjective: Patient states for the last 1-2 days, she has noticed a change of her discharge from old blood spotting when she wipes to bright red spotting when she wipes. Denies any pain, occasional cramping at night. She denies any further large bleeding episodes.  Patient reports good fetal movement.  She reports no uterine contractions and no loss of fluid per vagina.  Vitals:  Blood pressure 122/75, pulse (!) 102, temperature 98.3 F (36.8 C), temperature source Oral, resp. rate 18, height 5\' 2"  (1.575 m), weight 224 lb (101.6 kg), SpO2 95 %. Physical Examination: CONSTITUTIONAL: Well-developed, well-nourished female in no acute distress.  HEENT:  Normocephalic, atraumatic SKIN: Skin is warm and dry. No rash noted.  NEUROLGIC: Alert and oriented to person, place, and time.  PSYCHIATRIC: Normal mood and affect.  CARDIOVASCULAR: Normal heart rate noted, regular rhythm RESPIRATORY: Effort and breath sounds normal, no problems with respiration noted ABDOMEN: Soft, nontender, nondistended, gravid. CERVIX:  DID NOT PERFORM  Fetal monitoring: FHR: 150 bpm, Variability: moderate, Accelerations: Present, Decelerations: Absent  Uterine activity: NONE  I personally reviewed the patient's NST today, found to be REACTIVE.    Results for orders placed or performed during the hospital encounter of 08/09/16 (from the past 48 hour(s))  Type and screen [facility]     Status: None   Collection Time: 08/15/16  8:48 PM  Result Value Ref Range   ABO/RH(D) B POS    Antibody Screen NEG    Sample Expiration 08/18/2016     No results  found.  Current scheduled medications . prenatal multivitamin  1 tablet Oral Q1200    I have reviewed the patient's current medications.  ASSESSMENT: Patient Active Problem List   Diagnosis Date Noted  . Antepartum hemorrhage from placenta previa 08/09/2016    PLAN: Concern for new bleeding, will perform speculum exam. Continue routine antenatal care. Possibility for discharge today if no new active bleeding.   Jen MowElizabeth Mumaw, DO OB Fellow Faculty Practice, [facility]    ADDENDUM: 3:44 PM - SSE showed NO SIGNS OF ACTIVE BLEEDING, only old blood and yellowish discharge in the vagina/on cervix. Cervix appears closed visually. OK for discharge home.  Cleda ClarksElizabeth W. Mumaw, DO  OB Fellow [facility], [facility]

## 2016-09-03 ENCOUNTER — Telehealth: Payer: Self-pay | Admitting: *Deleted

## 2016-09-03 NOTE — Telephone Encounter (Addendum)
[facility]  She stated that a fax had been received of pt's discharge summary but no order for therapy services. Per chart review, I did not see any mention of pt needing PT or OT.  I stated that I would message Dr. Omer JackMumaw and then call back with additional information once a response is received.   1/25  Call returned to [facility] after receiving response from Dr. Omer JackMumaw.  I advised that Dr. Omer JackMumaw stated she did not fax information to their office or ask that it be faxed. Per her chart review, she did not see any indication for pt to have PT or OT services. [facility] voiced understanding.

## 2017-06-14 ENCOUNTER — Encounter (HOSPITAL_COMMUNITY): Payer: Self-pay
